# Patient Record
Sex: Male | Born: 1975 | Race: Black or African American | Hispanic: No | Marital: Married | Smoking: Never smoker
Health system: Southern US, Community
[De-identification: ages and names within clinical notes are randomized; demographics above are authoritative.]

## PROBLEM LIST (undated history)

## (undated) DIAGNOSIS — I10 Essential (primary) hypertension: Secondary | ICD-10-CM

## (undated) HISTORY — PX: HERNIA REPAIR: SHX51

---

## 1997-11-14 ENCOUNTER — Emergency Department (HOSPITAL_COMMUNITY): Admission: EM | Admit: 1997-11-14 | Discharge: 1997-11-14 | Payer: Self-pay | Admitting: Emergency Medicine

## 1998-07-26 ENCOUNTER — Emergency Department (HOSPITAL_COMMUNITY): Admission: EM | Admit: 1998-07-26 | Discharge: 1998-07-26 | Payer: Self-pay | Admitting: Emergency Medicine

## 1999-06-08 ENCOUNTER — Emergency Department (HOSPITAL_COMMUNITY): Admission: EM | Admit: 1999-06-08 | Discharge: 1999-06-08 | Payer: Self-pay | Admitting: Emergency Medicine

## 2000-03-19 ENCOUNTER — Encounter: Payer: Self-pay | Admitting: Emergency Medicine

## 2000-03-19 ENCOUNTER — Emergency Department (HOSPITAL_COMMUNITY): Admission: EM | Admit: 2000-03-19 | Discharge: 2000-03-19 | Payer: Self-pay | Admitting: Emergency Medicine

## 2000-07-25 ENCOUNTER — Emergency Department (HOSPITAL_COMMUNITY): Admission: EM | Admit: 2000-07-25 | Discharge: 2000-07-25 | Payer: Self-pay | Admitting: Emergency Medicine

## 2000-10-15 ENCOUNTER — Emergency Department (HOSPITAL_COMMUNITY): Admission: EM | Admit: 2000-10-15 | Discharge: 2000-10-15 | Payer: Self-pay | Admitting: Emergency Medicine

## 2000-12-06 ENCOUNTER — Emergency Department (HOSPITAL_COMMUNITY): Admission: EM | Admit: 2000-12-06 | Discharge: 2000-12-06 | Payer: Self-pay | Admitting: Emergency Medicine

## 2001-04-15 ENCOUNTER — Emergency Department (HOSPITAL_COMMUNITY): Admission: EM | Admit: 2001-04-15 | Discharge: 2001-04-15 | Payer: Self-pay | Admitting: Emergency Medicine

## 2003-01-26 ENCOUNTER — Encounter: Payer: Self-pay | Admitting: Emergency Medicine

## 2003-01-26 ENCOUNTER — Emergency Department (HOSPITAL_COMMUNITY): Admission: EM | Admit: 2003-01-26 | Discharge: 2003-01-26 | Payer: Self-pay | Admitting: Emergency Medicine

## 2004-02-11 ENCOUNTER — Emergency Department (HOSPITAL_COMMUNITY): Admission: EM | Admit: 2004-02-11 | Discharge: 2004-02-11 | Payer: Self-pay | Admitting: Emergency Medicine

## 2008-10-26 ENCOUNTER — Emergency Department (HOSPITAL_COMMUNITY): Admission: EM | Admit: 2008-10-26 | Discharge: 2008-10-26 | Payer: Self-pay | Admitting: Emergency Medicine

## 2013-06-20 ENCOUNTER — Emergency Department (HOSPITAL_COMMUNITY)
Admission: EM | Admit: 2013-06-20 | Discharge: 2013-06-21 | Disposition: A | Discharge status: Home / Self Care | Payer: BC Managed Care – PPO | Attending: Emergency Medicine | Admitting: Emergency Medicine

## 2013-06-20 ENCOUNTER — Encounter (HOSPITAL_COMMUNITY): Payer: Self-pay | Admitting: Emergency Medicine

## 2013-06-20 DIAGNOSIS — L03211 Cellulitis of face: Principal | ICD-10-CM | POA: Insufficient documentation

## 2013-06-20 DIAGNOSIS — L0201 Cutaneous abscess of face: Secondary | ICD-10-CM

## 2013-06-20 NOTE — ED Notes (Signed)
Pt c/o abscess onset this am to R forehead.

## 2013-06-20 NOTE — ED Provider Notes (Signed)
CSN: [id number]     Arrival date & time 06/20/13  2319 History   First MD Initiated Contact with Patient 06/20/13 2339     Chief Complaint  Patient presents with  . Abscess   (Consider location/radiation/quality/duration/timing/severity/associated sxs/prior Treatment) The history is provided by the patient and medical records. No language interpreter was used.    Jesus BanksJames M Flemmer is a 38 y.o. male  with no major medical care presents to the Emergency Department complaining of gradual, persistent, progressively worsening lesion with associated swelling to the right for head onset this morning. Patient reports he noticed a "pimple" yesterday evening but was not concerned about this. When he awoke this morning the lesion was larger and more swollen. As the day progressed the patient reports he's had more swelling to his face with increased swelling around his eye.  He denies fever, chills, headache neck pain, chest pain, shortness of breath abdominal pain nausea, vomiting, diarrhea.  He denies pain, vision changes or diplopia to the eye. Patient has not attempted any over-the-counter treatments. Nothing makes it better or worse. He reports not squeezing a pimple.  History reviewed. No pertinent past medical history. Past Surgical History  Procedure Laterality Date  . Hernia repair     No family history on file. History  Substance Use Topics  . Smoking status: Never Smoker   . Smokeless tobacco: Not on file  . Alcohol Use: No    Review of Systems  Constitutional: Negative for fever and chills.  HENT: Positive for facial swelling.   Gastrointestinal: Negative for nausea and vomiting.  Skin: Positive for rash.  Allergic/Immunologic: Negative for immunocompromised state.  Hematological: Does not bruise/bleed easily.  Psychiatric/Behavioral: The patient is not nervous/anxious.     Allergies  Review of patient's allergies indicates no known allergies.  Home Medications   Current  Outpatient Rx  Name  Route  Sig  Dispense  Refill  . clindamycin (CLEOCIN) 150 MG capsule   Oral   Take 3 capsules (450 mg total) by mouth 3 (three) times daily.   90 capsule   0    BP 159/103  Pulse 98  Temp(Src) 98.1 F (36.7 C) (Oral)  Resp 20  Wt 180 lb (81.647 kg)  SpO2 97% Physical Exam  Nursing note and vitals reviewed. Constitutional: He is oriented to person, place, and time. He appears well-developed and well-nourished. No distress.  HENT:  Head: Normocephalic and atraumatic.    Nose: Nose normal.  Mouth/Throat: Uvula is midline, oropharynx is clear and moist and mucous membranes are normal. No uvula swelling. No oropharyngeal exudate, posterior oropharyngeal edema, posterior oropharyngeal erythema or tonsillar abscesses.  2 x 3 cm area of induration and fluctuance to the right for head with mild erythema and swelling extending down the right side of the face around the lateral side of the right eye  EOM's intact without diplopia or pain No induration around the eye  Eyes: Conjunctivae and EOM are normal. Pupils are equal, round, and reactive to light. Right eye exhibits no chemosis, no discharge and no exudate. Left eye exhibits no chemosis, no discharge and no exudate. Right conjunctiva is not injected. Right conjunctiva has no hemorrhage. Left conjunctiva is not injected. Left conjunctiva has no hemorrhage. No scleral icterus.  Neck: Normal range of motion.  Cardiovascular: Normal rate, regular rhythm, normal heart sounds and intact distal pulses.   No murmur heard. Pulmonary/Chest: Effort normal and breath sounds normal. No respiratory distress. He has no wheezes.  Clear and  equal breath sounds  Abdominal: Soft. He exhibits no distension. There is no tenderness.  Lymphadenopathy:    He has no cervical adenopathy.  Neurological: He is alert and oriented to person, place, and time.  Skin: Skin is warm and dry. He is not diaphoretic. There is erythema.  Psychiatric:  He has a normal mood and affect.    ED Course  INCISION AND DRAINAGE Date/Time: 06/21/2013 1:03 AM Performed by: Dierdre Forth Authorized by: Dierdre Forth Consent: Verbal consent obtained. Risks and benefits: risks, benefits and alternatives were discussed Consent given by: patient Patient understanding: patient states understanding of the procedure being performed Patient consent: the patient's understanding of the procedure matches consent given Procedure consent: procedure consent matches procedure scheduled Relevant documents: relevant documents present and verified Site marked: the operative site was marked Required items: required blood products, implants, devices, and special equipment available Patient identity confirmed: verbally with patient and arm band Time out: Immediately prior to procedure a "time out" was called to verify the correct patient, procedure, equipment, support staff and site/side marked as required. Type: abscess Body area: head/neck (right forehead) Anesthesia: local infiltration Local anesthetic: lidocaine 2% without epinephrine Anesthetic total: 2 ml Patient sedated: no Scalpel size: 11 Incision type: single straight Complexity: simple Drainage: purulent Drainage amount: copious Wound treatment: wound left open Patient tolerance: Patient tolerated the procedure well with no immediate complications. Comments: Copious amount of solid material expressed   (including critical care time) Labs Review Labs Reviewed - No data to display Imaging Review No results found.  EKG Interpretation   None       MDM   1. Abscess of face      TAKASHI KOROL presents with abscess to the right for head with inflammation spreading to the right eye the face and around the right eye.  No induration to the tissues around the right eye, highly doubt periorbital cellulitis.  1:04 AM I&D without complication and a large amount of solid material  expressed.  Pt given Clinda IV for cellulitis.  No no hx of HIV, diabetes or IVDU.  Afebrile and not tachycardic.  Patient initially with hypertension likely secondary to stress.    Patient noted to be hypertensive in the emergency department.  No signs of hypertensive urgency.  Discussed with patient the need for close follow-up and management by their primary care physician.   Patient with skin abscess amenable to incision and drainage.  Abscess was not large enough to warrant packing or drain,  wound recheck in 2 days. Encouraged home warm soaks and flushing.  Mild signs of cellulitis is surrounding skin.  Will d/c to home with clindamycin.  It has been determined that no acute conditions requiring further emergency intervention are present at this time. The patient/guardian have been advised of the diagnosis and plan. We have discussed signs and symptoms that warrant return to the ED, such as changes or worsening in symptoms.   Vital signs are stable at discharge.   BP 159/103  Pulse 98  Temp(Src) 98.1 F (36.7 C) (Oral)  Resp 20  Wt 180 lb (81.647 kg)  SpO2 97%  Patient/guardian has voiced understanding and agreed to follow-up with the PCP or specialist.      Dierdre Forth, PA-C 06/21/13 0133

## 2013-06-21 MED ORDER — CLINDAMYCIN HCL 150 MG PO CAPS: 450.0000 mg | ORAL_CAPSULE | Freq: Three times a day (TID) | ORAL | Status: DC

## 2013-06-21 MED ORDER — IBUPROFEN 800 MG PO TABS
800.0000 mg | ORAL_TABLET | Freq: Once | ORAL | Status: AC
  Administered 2013-06-21: 800 mg via ORAL
  Filled 2013-06-21: qty 1

## 2013-06-21 MED ORDER — CLINDAMYCIN PHOSPHATE 900 MG/50ML IV SOLN
900.0000 mg | Freq: Once | INTRAVENOUS | Status: AC
  Administered 2013-06-21: 900 mg via INTRAVENOUS
  Filled 2013-06-21: qty 50

## 2013-06-21 NOTE — Discharge Instructions (Signed)
1. Medications: clindamycin, usual home medications 2. Treatment: rest, drink plenty of fluids, keep wound clean with warm soap and water, keep bandage dry 3. Follow Up: Please followup with your primary doctor for discussion of your diagnoses and further evaluation after today's visit; if you do not have a primary care doctor use the resource guide provided to find one;     Abscess Care After An abscess (also called a boil or furuncle) is an infected area that contains a collection of pus. Signs and symptoms of an abscess include pain, tenderness, redness, or hardness, or you may feel a moveable soft area under your skin. An abscess can occur anywhere in the body. The infection may spread to surrounding tissues causing cellulitis. A cut (incision) by the surgeon was made over your abscess and the pus was drained out. Gauze may have been packed into the space to provide a drain that will allow the cavity to heal from the inside outwards. The boil may be painful for 5 to 7 days. Most people with a boil do not have high fevers. Your abscess, if seen early, may not have localized, and may not have been lanced. If not, another appointment may be required for this if it does not get better on its own or with medications. HOME CARE INSTRUCTIONS   Only take over-the-counter or prescription medicines for pain, discomfort, or fever as directed by your caregiver.  When you bathe, soak and then remove gauze or iodoform packs at least daily or as directed by your caregiver. You may then wash the wound gently with mild soapy water. Repack with gauze or do as your caregiver directs. SEEK IMMEDIATE MEDICAL CARE IF:   You develop increased pain, swelling, redness, drainage, or bleeding in the wound site.  You develop signs of generalized infection including muscle aches, chills, fever, or a general ill feeling.  An oral temperature above 102 F (38.9 C) develops, not controlled by medication. See your  caregiver for a recheck if you develop any of the symptoms described above. If medications (antibiotics) were prescribed, take them as directed. Document Released: 11/22/2004 Document Revised: 07/29/2011 Document Reviewed: 07/20/2007 Dr [facility] Patient Information 2014 [employer], [place].    Emergency Department Resource Guide 1) Find a Doctor and Pay Out of Pocket Although you won't have to find out who is covered by your insurance plan, it is a good idea to ask around and get recommendations. You will then need to call the office and see if the doctor you have chosen will accept you as a new patient and what types of options they offer for patients who are self-pay. Some doctors offer discounts or will set up payment plans for their patients who do not have insurance, but you will need to ask so you aren't surprised when you get to your appointment.  2) Contact Your Local Health Department Not all health departments have doctors that can see patients for sick visits, but many do, so it is worth a call to see if yours does. If you don't know where your local health department is, you can check in your phone book. The CDC also has a tool to help you locate your state's health department, and many state websites also have listings of all of their local health departments.  3) Find a Walk-in Clinic If your illness is not likely to be very severe or complicated, you may want to try a walk in clinic. These are popping up all over the country in  pharmacies, drugstores, and shopping centers. They're usually staffed by nurse practitioners or physician assistants that have been trained to treat common illnesses and complaints. They're usually fairly quick and inexpensive. However, if you have serious medical issues or chronic medical problems, these are probably not your best option. ° °No Primary Care Doctor: °- Call Health Connect at  832-8000 - they can help you locate a primary care doctor that  accepts your  insurance, provides certain services, etc. °- Physician Referral Service- 1-800-533-3463 ° °Chronic Pain Problems: °Organization         Address  Phone   Notes  ° [facility]  (336) 297-2271 Patients need to be referred by their primary care doctor.  ° °Medication Assistance: °Organization         Address  Phone   Notes  °[place] [facility] [address] °[place], [place] [address] (336) 641-8030 --Must be a resident of [place] °-- Must have NO insurance coverage whatsoever (no Medicaid/ Medicare, etc.) °-- The pt. MUST have a primary care doctor that directs their care regularly and follows them in the community °  °[facility]  (866) 331-1348   °[employer]  (888) 892-1162   ° °Agencies that provide inexpensive medical care: °Organization         Address  Phone   Notes  °[facility]  (336) 832-8035   °[facility]    (336) 832-7272   °[facility] [address] °[place], [place] [address] (336) 832-4777   °[facility] [address], °[place] (336) 271-4999   °[facility]    (336) 373-0678   °[facility]    (336) 272-1050   °[facility] ° [address], [place] Phone:  (336) 832-4444, Fax:  (336) 832-4440 Hours of Operation:  9 am - 6 pm, M-F.  Also accepts Medicaid/Medicare and self-pay.  °[facility] ° [address], [place] Phone: (336) 832-3150, Fax: (336) [phone]. Hours of Operation:  8:30 am - 5:30 pm, M-F.  Also accepts Medicaid and self-pay.  °[facility] [place] [address], [place] Phone: (336) 878-6027   °[facility] [address], [place], [place] (336)723-1848, Ext. 123 Mondays & Thursdays: 7-9 AM.  First 15 patients are seen on a first come, first serve basis. °  ° °Medicaid-accepting [place] Providers: ° °Organization          Address  Phone   Notes  °[facility] [address], [place] (336) 641-2100 Also accepts self-pay patients.  °[facility] [address], [place] ° (336) 856-9996   °[facility] [address], [place] (336) 288-8857   °[facility] [address] [place] [address], [place] (336) 299-7000   °Veita Bland [address], [place]  ° (336) 373-1557 Only accepts [place] Access Medicaid patients after they have their name applied to their card.  ° °Self-Pay (no insurance) in [place]: ° °Organization         Address  Phone   Notes  °Sickle Cell Patients, [facility] [address], [place] (336) 832-1970   °[facility] [address],  (336) 832-4400   ° [facility] [address] ° [address],   [place] [phone]   [facility]/Dr. Osei-Bonsu  [address] [place] or [address], [place] ([phone] Phone number for both [place] and [place] locations is the same.  [facility] [address] [place] [phone]   [facility] [address] [place] or [address] [phone]   [facility] [address] [place] [phone], phone; [phone], fax Sees patients 1st and 3rd Saturday of every month.  Must not qualify for public or private insurance (i.e. Medicaid, Medicare, [place] Health Choice, Veterans' Benefits)  Household income should be no more than 200% of the poverty level The clinic cannot treat you if you are pregnant or think you are pregnant  Sexually transmitted diseases are not treated at the clinic.    Dental Care: Organization         Address  Phone  Notes  [facility] [place] [phone] Accepts children up to age 57 who are enrolled in [place] or [place]; pregnant women with a Medicaid card; and children who have applied for Medicaid or [place] Health Choice, but were declined, whose parents can pay a reduced fee at time of service.  [facility]  [address], [place] [phone] Accepts children up to age 5 who are enrolled in [place] or [place]; pregnant women with a Medicaid card; and children who have applied for Medicaid or [place] Health Choice, but were declined, whose parents can pay a reduced fee at time of service.  [facility]  [place] [phone] Patients are seen by appointment only. Walk-ins are not accepted. [place] will see patients 75 years of age and older. Monday - Tuesday (8am-5pm) Most Wednesdays (8:30-5pm) $30 per visit, cash only  [facility]  [address], [facility] (830)[phone] Patients are seen by appointment only. Walk-ins are not accepted. [place] will see patients 44 years of age and older. One Wednesday Evening (Monthly: Volunteer Based).  $30 per visit, cash only  [place]  ([phone]) [phone] for adults; Children under age 38, call Graduate Pediatric Dentistry at [phone]. Children aged 55-14, please call [phone] to request a pediatric application.  Dental services are provided in all areas of dental care including fillings, crowns and bridges, complete and partial dentures, implants, gum treatment, root canals, and extractions. Preventive care is also provided. Treatment is provided to both adults and children. Patients are selected via a lottery and there is often a waiting list.   [facility] [address] [place]  [phone] [email or web address]   [facility] [address] [place], [place]  (205)[phone], Ext. 123 Second and Fourth Thursday of each month, opens at 6:30 AM; Clinic ends at 9 AM.  Patients are seen on a first-come first-served basis, and a limited number are seen during each clinic.   [facility]  [address] Hillard Danker [place], [place] [phone]   Eligibility Requirements You must have lived in [place], [place], or [place] counties for at least the last three months.   You cannot be eligible for state or federal sponsored [employer], including [employer], [place], or [employer].   You generally cannot  be eligible for healthcare insurance through your employer.  °  How to apply: °Eligibility screenings are held every Tuesday and Wednesday afternoon from 1:00 pm until 4:00 pm. You do not need an appointment for the interview!  °[facility] [address], [place], [place] [phone]   °[place] [facility]  336-342-8273   °[facility]  336-703-3100   °[facility]  336-570-6415   ° °Behavioral Health Resources in the Community: °Intensive Outpatient Programs °Organization         Address  Phone  Notes  °[facility] [address], [place], [place] 336-878-6098   °[facility] [address], [place], [place] 336-832-9800   °[facility] [address], [place], [place] ° 336-882-2125   °[facility] [address],  °[place], [place] 1-800-853-5163 or 336-641-4981   °Substance Abuse Resources °Organization         Address  Phone  Notes  °[facility]  336-882-2125   °[facility]  336-784-9470   °[facility]  336-285-9073   °[facility]  336-845-3988   °Residential & Outpatient Substance Abuse Program  1-800-659-3381   °Psychological Services °Organization         Address  Phone  Notes  °[facility]  336- 832-9600   °[facility]  336- 378-7881    °[facility] [address], [place] 1-800-853-5163 or 336-641-4981   ° °Mobile Crisis Teams °Organization         Address  Phone  Notes  °[facility], Mobile Crisis Care Unit  1-877-626-1772   °Assertive °[facility] ° [address] [place], [place] 336-834-9664   °Sharon DeEsch [address] °[place] 336-554-5454   ° °Self-Help/Support Groups °Organization         Address  Phone             Notes  °[facility] [place] - variety of support groups  336- 373-1402 Call for more information  °Narcotics Anonymous (NA), [facility] [address], °[place]  2 meetings at this location  ° °Residential Treatment Programs °Organization         Address  Phone  Notes  °[facility] [address],    °[place]  1-866-801-8205   °[facility] ° [address], [place], [place] 704-293-8524   °[facility] [address], [place] 336-845-3988 Admissions: 8am-3pm M-F  °[facility] [address]    °[place], [place] 336-841-1104   °[facility] [address], [place], [place] 336-379-7146   °[facility] [address]  °[place], [place] 336-285-9073   °[facility] - Intensive Outpatient [address], [place], [place] 336-852-3033   °[facility] [address]  °[place], [place] 1-877-615-2722 or 336-784-9470   °[facility] [address] [place], [place] 336-227-7417 Accepts Medicaid  °[facility] [address]  °[place] 1-800-659-3381 Substance Abuse/Addiction Treatment  ° °[place] Behavioral Health Resources °Organization         Address  Phone  Notes  °[facility]  (888) 581-9988   °Julie Brannon, PhD [address] [place],    (336) 349-5553 or (336) 951-0000   °[facility]   [address]   [place], [place] 612 789 1362   [facility] [address] [place], [place] [phone] Insurance/Medicaid/sponsorship through [facility] and Families [address] [place], [place] 337-649-2957 [place], [place] [phone]    Dr. Adele Schilder  [phone]   [facility] Dept. 1) [address], [place] 2) [place] 3)  [address], [place] [phone]  [phone]   [place] [phone] or [phone] (After Hours)

## 2013-06-21 NOTE — ED Notes (Signed)
Per PA, patient to be moved to [facility]

## 2013-06-22 NOTE — ED Provider Notes (Signed)
Medical screening examination/treatment/procedure(s) were performed by non-physician practitioner and as supervising physician I was immediately available for consultation/collaboration.  EKG Interpretation   None        Derwood KaplanAnkit Aashna Matson, MD 06/22/13 0100

## 2014-05-05 ENCOUNTER — Emergency Department (HOSPITAL_COMMUNITY): Payer: BC Managed Care – PPO

## 2014-05-05 ENCOUNTER — Encounter (HOSPITAL_COMMUNITY): Payer: Self-pay | Admitting: *Deleted

## 2014-05-05 ENCOUNTER — Emergency Department (HOSPITAL_COMMUNITY)
Admission: EM | Admit: 2014-05-05 | Discharge: 2014-05-05 | Disposition: A | Discharge status: Home / Self Care | Payer: BC Managed Care – PPO | Attending: Emergency Medicine | Admitting: Emergency Medicine

## 2014-05-05 DIAGNOSIS — J069 Acute upper respiratory infection, unspecified: Secondary | ICD-10-CM | POA: Insufficient documentation

## 2014-05-05 DIAGNOSIS — Z792 Long term (current) use of antibiotics: Secondary | ICD-10-CM | POA: Diagnosis not present

## 2014-05-05 DIAGNOSIS — I10 Essential (primary) hypertension: Secondary | ICD-10-CM | POA: Diagnosis not present

## 2014-05-05 DIAGNOSIS — R05 Cough: Secondary | ICD-10-CM

## 2014-05-05 DIAGNOSIS — R059 Cough, unspecified: Secondary | ICD-10-CM

## 2014-05-05 HISTORY — DX: Essential (primary) hypertension: I10

## 2014-05-05 LAB — RAPID STREP SCREEN (MED CTR MEBANE ONLY): Streptococcus, Group A Screen (Direct): NEGATIVE

## 2014-05-05 MED ORDER — BENZONATATE 100 MG PO CAPS: 100.0000 mg | ORAL_CAPSULE | Freq: Three times a day (TID) | ORAL | Status: DC | PRN

## 2014-05-05 MED ORDER — HYDROCODONE-ACETAMINOPHEN 7.5-325 MG/15ML PO SOLN: 10.0000 mL | Freq: Four times a day (QID) | ORAL | Status: DC | PRN

## 2014-05-05 NOTE — ED Provider Notes (Signed)
CSN: [id number]     Arrival date & time 05/05/14  0127 History   First MD Initiated Contact with Patient 05/05/14 0248     Chief Complaint  Patient presents with  . Cough  . Chest Pain     (Consider location/radiation/quality/duration/timing/severity/associated sxs/prior Treatment) HPI Comments: The patient is a 38 year old male presenting to the emergency room chief complaint of persistent cough for 5 days.  Reports cough,worsening.  Reports "coughing spells" where he can't stop coughing. Patient reports cough is dry. He reports associated sore throat, nasal congestion. Denies fever or chills. He reports taking Delsym and allergy medication without full resolution of symptoms.  He reports central, right-sided chest discomfort with coughing. No chest pain while in ED. No known sick contacts.   The history is provided by the patient. No language interpreter was used.    Past Medical History  Diagnosis Date  . Hypertension    Past Surgical History  Procedure Laterality Date  . Hernia repair     History reviewed. No pertinent family history. History  Substance Use Topics  . Smoking status: Never Smoker   . Smokeless tobacco: Not on file  . Alcohol Use: No    Review of Systems  Constitutional: Negative for fever and chills.  HENT: Positive for congestion, postnasal drip, rhinorrhea and sore throat.   Respiratory: Positive for cough. Negative for shortness of breath.       Allergies  Review of patient's allergies indicates no known allergies.  Home Medications   Prior to Admission medications   Medication Sig Start Date End Date Taking? Authorizing Provider  clindamycin (CLEOCIN) 150 MG capsule Take 3 capsules (450 mg total) by mouth 3 (three) times daily. 06/21/13   Hannah Muthersbaugh, PA-C   BP 158/100 mmHg  Pulse 83  Temp(Src) 98 F (36.7 C) (Oral)  Resp 20  SpO2 98% Physical Exam  Constitutional: He is oriented to person, place, and time. He appears  well-developed and well-nourished. No distress.  HENT:  Head: Normocephalic and atraumatic.  Nose: Rhinorrhea present.  Mouth/Throat: Uvula is midline, oropharynx is clear and moist and mucous membranes are normal. No oropharyngeal exudate, posterior oropharyngeal edema or posterior oropharyngeal erythema.  Eyes: EOM are normal.  Neck: Neck supple.  Cardiovascular: Normal rate and regular rhythm.   Pulmonary/Chest: Effort normal. No respiratory distress. He has no wheezes. He has no rales. He exhibits no tenderness.  Neurological: He is oriented to person, place, and time.  Skin: Skin is warm and dry. He is not diaphoretic.  Psychiatric: He has a normal mood and affect. His behavior is normal.  Nursing note and vitals reviewed.   ED Course  Procedures (including critical care time) Labs Review Labs Reviewed  RAPID STREP SCREEN  CULTURE, GROUP A STREP    Imaging Review Dg Chest 2 View  05/05/2014   CLINICAL DATA:  Nonproductive cough.  EXAM: CHEST  2 VIEW  COMPARISON:  February 11, 2004.  FINDINGS: The heart size and mediastinal contours are within normal limits. Both lungs are clear. No pneumothorax or pleural effusion is noted. The visualized skeletal structures are unremarkable.  IMPRESSION: No acute cardiopulmonary abnormality seen.   Electronically Signed   By: Roque LiasJames  Green M.D.   On: 05/05/2014 02:14     EKG Interpretation None      MDM   Final diagnoses:  Cough    Pt CXR negative for acute infiltrate, chest discomfort only with coughing, likely inflammation, discussed ibuprofen. Patients symptoms are consistent with URI, likely  viral etiology. Discussed that antibiotics are not indicated for viral infections. Pt will be discharged with symptomatic treatment.  Verbalizes understanding and is agreeable with plan. Pt is hemodynamically stable & in NAD prior to dc.  Meds given in ED:  Medications - No data to display  New Prescriptions   BENZONATATE (TESSALON PERLES)  100 MG CAPSULE    Take 1 capsule (100 mg total) by mouth 3 (three) times daily as needed for cough.   HYDROCODONE-ACETAMINOPHEN (HYCET) 7.5-325 MG/15 ML SOLUTION    Take 10 mLs by mouth 4 (four) times daily as needed for moderate pain.     Mellody DrownLauren Maddock Finigan, PA-C 05/05/14 [phone]  Suzi RootsKevin E Steinl, MD 05/06/14 [phone]

## 2014-05-05 NOTE — Discharge Instructions (Signed)
Call for a follow up appointment with a Family or Primary Care Provider.  Return if Symptoms worsen.   Take medication as prescribed.  Drink plenty of fluids. Saltwater gargles 3-4 times a day. Mucinex DM for relief of URI symptoms. Do not drink alcohol, take extra Tylenol, operate heavy machinery taking narcotic cough syrup.

## 2014-05-05 NOTE — ED Provider Notes (Signed)
Medical screening examination/treatment/procedure(s) were conducted as a shared visit with non-physician practitioner(s) and myself.  I personally evaluated the patient during the encounter.   Date: 05/05/2014  Rate: 86  Rhythm: normal sinus rhythm and premature ventricular contractions (PVC)  QRS Axis: normal  Intervals: normal  ST/T Wave abnormalities: normal  Conduction Disutrbances:none  Narrative Interpretation:   Old EKG Reviewed: none available   Pt c/o non prod cough/congestion for the past 4-5 days. Chest discomfort w coughing episodes.  Chest cta. Rrr.     Suzi RootsKevin E Conrad Zajkowski, MD 05/05/14 [phone]

## 2014-05-05 NOTE — ED Notes (Signed)
Pt reports non-productive cough x5 days and sore throat - central chest pain described as sore that began yesterday. Pt denies any fever, n/v, diaphoresis or light-headedness.

## 2014-05-06 LAB — CULTURE, GROUP A STREP

## 2014-08-08 ENCOUNTER — Ambulatory Visit
Admission: RE | Admit: 2014-08-08 | Discharge: 2014-08-08 | Disposition: A | Discharge status: Home / Self Care | Payer: BC Managed Care – PPO | Source: Ambulatory Visit | Attending: Internal Medicine | Admitting: Internal Medicine

## 2014-08-08 ENCOUNTER — Other Ambulatory Visit: Payer: Self-pay | Admitting: Internal Medicine

## 2014-08-08 DIAGNOSIS — M542 Cervicalgia: Secondary | ICD-10-CM

## 2014-11-27 ENCOUNTER — Encounter (HOSPITAL_COMMUNITY): Payer: Self-pay

## 2014-11-27 ENCOUNTER — Emergency Department (HOSPITAL_COMMUNITY)
Admission: EM | Admit: 2014-11-27 | Discharge: 2014-11-27 | Disposition: A | Discharge status: Home / Self Care | Payer: BC Managed Care – PPO | Attending: Emergency Medicine | Admitting: Emergency Medicine

## 2014-11-27 DIAGNOSIS — Z79899 Other long term (current) drug therapy: Secondary | ICD-10-CM | POA: Insufficient documentation

## 2014-11-27 DIAGNOSIS — I10 Essential (primary) hypertension: Secondary | ICD-10-CM | POA: Diagnosis not present

## 2014-11-27 DIAGNOSIS — Y288XXA Contact with other sharp object, undetermined intent, initial encounter: Secondary | ICD-10-CM | POA: Insufficient documentation

## 2014-11-27 DIAGNOSIS — S61303A Unspecified open wound of left middle finger with damage to nail, initial encounter: Secondary | ICD-10-CM | POA: Diagnosis not present

## 2014-11-27 DIAGNOSIS — Y9289 Other specified places as the place of occurrence of the external cause: Secondary | ICD-10-CM | POA: Insufficient documentation

## 2014-11-27 DIAGNOSIS — Y9389 Activity, other specified: Secondary | ICD-10-CM | POA: Insufficient documentation

## 2014-11-27 DIAGNOSIS — S6992XA Unspecified injury of left wrist, hand and finger(s), initial encounter: Secondary | ICD-10-CM | POA: Diagnosis present

## 2014-11-27 DIAGNOSIS — S61209A Unspecified open wound of unspecified finger without damage to nail, initial encounter: Secondary | ICD-10-CM

## 2014-11-27 DIAGNOSIS — Y998 Other external cause status: Secondary | ICD-10-CM | POA: Insufficient documentation

## 2014-11-27 DIAGNOSIS — Z7951 Long term (current) use of inhaled steroids: Secondary | ICD-10-CM | POA: Diagnosis not present

## 2014-11-27 MED ORDER — BACITRACIN ZINC 500 UNIT/GM EX OINT: 1.0000 "application " | TOPICAL_OINTMENT | Freq: Two times a day (BID) | CUTANEOUS | Status: DC

## 2014-11-27 NOTE — Discharge Instructions (Signed)
Finger Avulsion  °When the tip of the finger is lost, a new nail may grow back if part of the fingernail is left. The new nail may be deformed. If just the tip of the finger is lost, no repair may be needed unless there is bone showing. If bone is showing, your caregiver may need to remove the protruding bone and put on a bandage. Your caregiver will do what is best for you. Most of the time when a fingertip is lost, the end will gradually grow back on and look fairly normal, but it may remain sensitive to pressure and temperature extremes for a long time. °HOME CARE INSTRUCTIONS  °· Keep your hand elevated above your heart to relieve pain and swelling. °· Keep your dressing dry and clean. °· Change your bandage in 24 hours or as directed. °· Only take over-the-counter or prescription medicines for pain, discomfort, or fever as directed by your caregiver. °· See your caregiver as needed for problems. °SEEK MEDICAL CARE IF:  °· You have increased pain, swelling, drainage, or bleeding. °· You have a fever. °· You have swelling that spreads from your finger and into your hand. °Make sure to check to see if you need a tetanus booster. °Document Released: 07/15/2001 Document Revised: 11/05/2011 Document Reviewed: 06/09/2008 °[employer]® Patient Information ©2015 [employer]. This information is not intended to replace advice given to you by your health care provider. Make sure you discuss any questions you have with your health care provider. ° °

## 2014-11-27 NOTE — ED Notes (Signed)
Patient cut his finger on a banner earlier this evening, around 1700.

## 2014-11-27 NOTE — ED Provider Notes (Signed)
CSN: [id number]     Arrival date & time 11/27/14  0021 History   First MD Initiated Contact with Patient 11/27/14 0035     Chief Complaint  Patient presents with  . Laceration     (Consider location/radiation/quality/duration/timing/severity/associated sxs/prior Treatment) Patient is a 39 y.o. male presenting with skin laceration. The history is provided by the patient. No language interpreter was used.  Laceration Location:  Finger Finger laceration location:  L middle finger Depth:  Cutaneous Quality: avulsion   Bleeding: controlled   Time since incident:  7 hours Laceration mechanism:  Metal edge Pain details:    Quality:  Aching   Severity:  Mild   Timing:  Constant   Progression:  Unchanged Foreign body present:  No foreign bodies Relieved by:  Nothing Worsened by:  Pressure Ineffective treatments:  None tried Tetanus status:  Up to date   Past Medical History  Diagnosis Date  . Hypertension    Past Surgical History  Procedure Laterality Date  . Hernia repair     History reviewed. No pertinent family history. History  Substance Use Topics  . Smoking status: Never Smoker   . Smokeless tobacco: Not on file  . Alcohol Use: No    Review of Systems  Musculoskeletal: Negative for myalgias and arthralgias.  Skin: Positive for wound.  Neurological: Negative for numbness.  All other systems reviewed and are negative.   Allergies  Review of patient's allergies indicates no known allergies.  Home Medications   Prior to Admission medications   Medication Sig Start Date End Date Taking? Authorizing Provider  bacitracin ointment Apply 1 application topically 2 (two) times daily. 11/27/14   Antony MaduraKelly Madine Sarr, PA-C  benzonatate (TESSALON PERLES) 100 MG capsule Take 1 capsule (100 mg total) by mouth 3 (three) times daily as needed for cough. 05/05/14   Mellody DrownLauren Parker, PA-C  clindamycin (CLEOCIN) 150 MG capsule Take 3 capsules (450 mg total) by mouth 3 (three) times  daily. Patient not taking: Reported on 05/05/2014 06/21/13   Dahlia ClientHannah Muthersbaugh, PA-C  dextromethorphan (DELSYM) 30 MG/5ML liquid Take 60 mg by mouth as needed for cough.    Historical Provider, MD  fexofenadine-pseudoephedrine (ALLEGRA-D) 60-120 MG per tablet Take 1 tablet by mouth 2 (two) times daily.    Historical Provider, MD  fluticasone (FLONASE) 50 MCG/ACT nasal spray Place 1 spray into both nostrils daily.    Historical Provider, MD  HYDROcodone-acetaminophen (HYCET) 7.5-325 mg/15 ml solution Take 10 mLs by mouth 4 (four) times daily as needed for moderate pain. 05/05/14   Mellody DrownLauren Parker, PA-C  telmisartan (MICARDIS) 80 MG tablet Take 80 mg by mouth daily.    Historical Provider, MD   BP 163/116 mmHg  Pulse 87  Temp(Src) 98.6 F (37 C) (Oral)  Resp 18  SpO2 99%   Physical Exam  Constitutional: He is oriented to person, place, and time. He appears well-developed and well-nourished. No distress.  HENT:  Head: Normocephalic and atraumatic.  Eyes: Conjunctivae and EOM are normal. No scleral icterus.  Neck: Normal range of motion.  Cardiovascular: Normal rate, regular rhythm and intact distal pulses.   Distal radial pulse 2+ in the left upper extremity.  Pulmonary/Chest: Effort normal. No respiratory distress.  Musculoskeletal: Normal range of motion.       Left hand: He exhibits tenderness. He exhibits normal range of motion, no bony tenderness, normal capillary refill and no swelling. Normal sensation noted. Normal strength noted.       Hands: Neurological: He is alert and  oriented to person, place, and time. He exhibits normal muscle tone. Coordination normal.  Sensation to light touch intact. Patient able to wiggle all fingers of left hand  Skin: Skin is warm and dry. No rash noted. He is not diaphoretic. No erythema. No pallor.  0.25cm avulsion to fat pad of L 3rd digit.  Psychiatric: He has a normal mood and affect. His behavior is normal.  Nursing note and vitals  reviewed.   ED Course  Procedures (including critical care time) Labs Review Labs Reviewed - No data to display  Imaging Review No results found.   EKG Interpretation None      MDM   Final diagnoses:  Avulsion of fingertip, initial encounter    39 year old male presents to the emergency department for evaluation of avulsion to his left third fingertip. No bony exposure or foreign bodies. Patient neurovascularly intact. Tetanus up-to-date. Bleeding controlled with pressure prior to arrival; no active bleeding. Wound dressed and wound care discussed with patient. He is stable for discharge with instructions for outpatient management. Return precautions discussed and provided. Patient agreeable to plan with no unaddressed concerns. Patient discharged in good condition.     Antony Madura, PA-C 11/27/14 0109  Derwood Kaplan, MD 11/27/14 [phone]

## 2015-02-10 ENCOUNTER — Emergency Department (HOSPITAL_COMMUNITY)
Admission: EM | Admit: 2015-02-10 | Discharge: 2015-02-10 | Disposition: A | Discharge status: Home / Self Care | Payer: BC Managed Care – PPO | Attending: Emergency Medicine | Admitting: Emergency Medicine

## 2015-02-10 ENCOUNTER — Encounter (HOSPITAL_COMMUNITY): Payer: Self-pay | Admitting: Cardiology

## 2015-02-10 ENCOUNTER — Emergency Department (HOSPITAL_COMMUNITY): Payer: BC Managed Care – PPO

## 2015-02-10 DIAGNOSIS — R079 Chest pain, unspecified: Secondary | ICD-10-CM | POA: Diagnosis present

## 2015-02-10 DIAGNOSIS — R42 Dizziness and giddiness: Secondary | ICD-10-CM | POA: Diagnosis not present

## 2015-02-10 DIAGNOSIS — I10 Essential (primary) hypertension: Secondary | ICD-10-CM | POA: Insufficient documentation

## 2015-02-10 DIAGNOSIS — K2970 Gastritis, unspecified, without bleeding: Secondary | ICD-10-CM | POA: Diagnosis not present

## 2015-02-10 DIAGNOSIS — Z79899 Other long term (current) drug therapy: Secondary | ICD-10-CM | POA: Diagnosis not present

## 2015-02-10 DIAGNOSIS — Z7951 Long term (current) use of inhaled steroids: Secondary | ICD-10-CM | POA: Diagnosis not present

## 2015-02-10 LAB — URINALYSIS, ROUTINE W REFLEX MICROSCOPIC
Bilirubin Urine: NEGATIVE
Glucose, UA: NEGATIVE mg/dL
Hgb urine dipstick: NEGATIVE
KETONES UR: NEGATIVE mg/dL
Leukocytes, UA: NEGATIVE
NITRITE: NEGATIVE
Protein, ur: NEGATIVE mg/dL
Specific Gravity, Urine: 1.018 (ref 1.005–1.030)
UROBILINOGEN UA: 0.2 mg/dL (ref 0.0–1.0)
pH: 7 (ref 5.0–8.0)

## 2015-02-10 LAB — BASIC METABOLIC PANEL
Anion gap: 6 (ref 5–15)
BUN: 9 mg/dL (ref 6–20)
CHLORIDE: 107 mmol/L (ref 101–111)
CO2: 26 mmol/L (ref 22–32)
Calcium: 9.1 mg/dL (ref 8.9–10.3)
Creatinine, Ser: 1.23 mg/dL (ref 0.61–1.24)
GFR calc Af Amer: >60 mL/min (ref >60)
Glucose, Bld: 102 mg/dL — ABNORMAL HIGH (ref 65–99)
POTASSIUM: 3.6 mmol/L (ref 3.5–5.1)
Sodium: 139 mmol/L (ref 135–145)

## 2015-02-10 LAB — RAPID URINE DRUG SCREEN, HOSP PERFORMED
Amphetamines: NOT DETECTED
BARBITURATES: NOT DETECTED
BENZODIAZEPINES: NOT DETECTED
COCAINE: NOT DETECTED
OPIATES: NOT DETECTED
Tetrahydrocannabinol: NOT DETECTED

## 2015-02-10 LAB — CBC
HEMATOCRIT: 43.6 % (ref 39.0–52.0)
Hemoglobin: 15.5 g/dL (ref 13.0–17.0)
MCH: 29.8 pg (ref 26.0–34.0)
MCHC: 35.6 g/dL (ref 30.0–36.0)
MCV: 83.8 fL (ref 78.0–100.0)
PLATELETS: 238 10*3/uL (ref 150–400)
RBC: 5.2 MIL/uL (ref 4.22–5.81)
RDW: 12.5 % (ref 11.5–15.5)
WBC: 6.7 10*3/uL (ref 4.0–10.5)

## 2015-02-10 MED ORDER — ASPIRIN 81 MG PO CHEW
324.0000 mg | CHEWABLE_TABLET | Freq: Once | ORAL | Status: AC
  Administered 2015-02-10: 324 mg via ORAL
  Filled 2015-02-10: qty 4

## 2015-02-10 MED ORDER — MECLIZINE HCL 12.5 MG PO TABS: 12.5000 mg | ORAL_TABLET | Freq: Three times a day (TID) | ORAL | Status: DC | PRN

## 2015-02-10 MED ORDER — OMEPRAZOLE 20 MG PO CPDR: 20.0000 mg | DELAYED_RELEASE_CAPSULE | Freq: Every day | ORAL | Status: DC

## 2015-02-10 MED ORDER — GI COCKTAIL ~~LOC~~
30.0000 mL | Freq: Once | ORAL | Status: AC
  Administered 2015-02-10: 30 mL via ORAL
  Filled 2015-02-10: qty 30

## 2015-02-10 NOTE — ED Notes (Signed)
Patient transported to X-ray 

## 2015-02-10 NOTE — ED Provider Notes (Signed)
CSN: [id number]     Arrival date & time 02/10/15  0805 History   First MD Initiated Contact with Patient 02/10/15 0831     Chief Complaint  Patient presents with  . Chest Pain  . Dizziness     (Consider location/radiation/quality/duration/timing/severity/associated sxs/prior Treatment) HPI   Patient is a 39 year old male with history of hypertension, who presents to emergency room with intermittent left-sided chest pain which began yesterday evening while eating dinner. He describes it as a squeezing without radiation, rated 6 out of 10, last for a few minutes and then he has no pain for 15-30 minutes and it will occur again.  The patient has no associated symptoms with his chest pain, no diaphoresis, no nausea, no vomiting, no lightheadedness, no syncope. He denies any recent URI, denies cough, denies any physical exertion or trauma to his chest. His pain is not worsened with movement or with inspiration. Patient is also complaining of a posterior left neck pain which feels like a knot. He states that he was sitting at his desk at work proximally 2 PM yesterday when he first felt his neck pain, it is been constant since its onset, not worsened with movement or palpation. He doesn't have any stiffness of his neck, he denies having any difficulty swallowing.   Patient is also complaining of intermittent dizziness which she describes as "woozy, like I'm going to fall". He is experiencing dizziness when he stands up or when he gets out of bed. He has not any nausea or vomiting associated with this, and he is not on consciousness. He denies any tinnitus, fullness in his ears, decreased hearing. He denies any ear pain and states he is not having any upper respiratory infection or allergic rhinitis symptoms.  Past Medical History  Diagnosis Date  . Hypertension    Past Surgical History  Procedure Laterality Date  . Hernia repair     History reviewed. No pertinent family history. Social History   Substance Use Topics  . Smoking status: Never Smoker   . Smokeless tobacco: None  . Alcohol Use: No    Review of Systems 10 Systems reviewed and are negative for acute change except as noted in the HPI.    Allergies  Review of patient's allergies indicates no known allergies.  Home Medications   Prior to Admission medications   Medication Sig Start Date End Date Taking? Authorizing Provider  fluticasone (FLONASE) 50 MCG/ACT nasal spray Place 1 spray into both nostrils daily.   Yes Historical Provider, MD  telmisartan-hydrochlorothiazide (MICARDIS HCT) 80-12.5 MG per tablet Take 1 tablet by mouth daily.   Yes Historical Provider, MD  bacitracin ointment Apply 1 application topically 2 (two) times daily. 11/27/14   Antony Madura, PA-C  benzonatate (TESSALON PERLES) 100 MG capsule Take 1 capsule (100 mg total) by mouth 3 (three) times daily as needed for cough. 05/05/14   Mellody Drown, PA-C  dextromethorphan (DELSYM) 30 MG/5ML liquid Take 60 mg by mouth as needed for cough.    Historical Provider, MD  HYDROcodone-acetaminophen (HYCET) 7.5-325 mg/15 ml solution Take 10 mLs by mouth 4 (four) times daily as needed for moderate pain. 05/05/14   Mellody Drown, PA-C  meclizine (ANTIVERT) 12.5 MG tablet Take 1 tablet (12.5 mg total) by mouth 3 (three) times daily as needed for dizziness. 02/10/15   Danelle Berry, PA-C  omeprazole (PRILOSEC) 20 MG capsule Take 1 capsule (20 mg total) by mouth daily. 02/10/15   Danelle Berry, PA-C   BP 144/94 mmHg  Pulse 70  Temp(Src) 98.1 F (36.7 C) (Oral)  Resp 16  Ht  (1.778 m)  Wt 185 lb (83.915 kg)  BMI 26.54 kg/m2  SpO2 100% Physical Exam  Constitutional: He is oriented to person, place, and time. He appears well-developed and well-nourished. He is cooperative.  Non-toxic appearance. He does not have a sickly appearance. He does not appear ill. No distress.  Well-developed, well-nourished, well-appearing male appears stated age  HENT:  Head:  Normocephalic and atraumatic.  Nose: Nose normal.  Mouth/Throat: Oropharynx is clear and moist. No oropharyngeal exudate.  Eyes: Conjunctivae and EOM are normal. Pupils are equal, round, and reactive to light. Right eye exhibits no discharge. Left eye exhibits no discharge. No scleral icterus.  No nystagmus, normal EOMs  Neck: Normal range of motion and full passive range of motion without pain. Neck supple. No JVD present. No spinous process tenderness present. No rigidity. No tracheal deviation, no edema, no erythema and normal range of motion present. No thyromegaly present.  No tenderness to posterior neck muscles, no muscle spasm present, no visible abnormality, no cervical spine spinal step-off or tenderness  Cardiovascular: Normal rate, regular rhythm, normal heart sounds and intact distal pulses.  Exam reveals no gallop and no friction rub.   No murmur heard. Symmetrical peripheral pulses palpated posterior tibialis 2+, radial pulses 2+, no carotid bruit, no lower external edema  Pulmonary/Chest: Effort normal and breath sounds normal. No respiratory distress. He has no wheezes. He has no rales. He exhibits no tenderness.  Clear to auscultation anteriorly and posteriorly without wheezes, rales or rhonchi, no chest tenderness to palpation  Abdominal: Soft. Bowel sounds are normal. He exhibits no distension and no mass. There is no tenderness. There is no rebound and no guarding.  Musculoskeletal: Normal range of motion. He exhibits no edema or tenderness.  Lymphadenopathy:    He has no cervical adenopathy.  Neurological: He is alert and oriented to person, place, and time. He has normal reflexes. No cranial nerve deficit. He exhibits normal muscle tone. Coordination normal.  Speech is clear and goal oriented, follows commands Major Cranial nerves without deficit, no facial droop Normal strength in upper and lower extremities bilaterally including dorsiflexion and plantar flexion, strong and  equal grip strength Sensation normal to light and sharp touch Moves extremities without ataxia, coordination intact Normal finger to nose and rapid alternating movements Neg romberg, no pronator drift Normal gait and balance   Skin: Skin is warm and dry. No rash noted. He is not diaphoretic. No erythema. No pallor.  Psychiatric: He has a normal mood and affect. His behavior is normal. Judgment and thought content normal.  Nursing note and vitals reviewed.   ED Course  Procedures (including critical care time) Labs Review Labs Reviewed  BASIC METABOLIC PANEL - Abnormal; Notable for the following:    Glucose, Bld 102 (*)    All other components within normal limits  CBC  URINALYSIS, ROUTINE W REFLEX MICROSCOPIC (NOT AT [facility])  URINE RAPID DRUG SCREEN, HOSP PERFORMED  I-STAT TROPOININ, ED    Imaging Review No results found. I have personally reviewed and evaluated these images and lab results as part of my medical decision-making.   EKG Interpretation   Date/Time:  Friday February 10 2015 08:08:07 EDT Ventricular Rate:  84 PR Interval:  160 QRS Duration: 90 QT Interval:  372 QTC Calculation: 439 R Axis:   92 Text Interpretation:  Normal sinus rhythm Rightward axis Borderline ECG  agree. no interval change  Confirmed by Donnald Garre, MD, Lebron Conners [phone]) on  02/10/2015 9:31:29 AM      MDM   Final diagnoses:  Gastritis  Dizziness    Patient with intermittent left sided chest pain, not reproduced with movement, inspiration, not associated with palpitations, diaphoresis, nausea or vomiting First experience pain while eating and has come and gone without any alleviating or reading factors. A chest pain workup was initiated, patient was given 325 of aspirin, and a GI cocktail He reported significant improvement of his discomfort with the GI cocktail  He complained of left posterior neck pain which was not reproducible with movement and not associated with any neck stiffness, he  has no headache and has no neurological deficits.  The symptoms do not seem to be related.  Pt also complaining of dizziness, sx reproduced with movement with PE, no nystagmus observed - trial of meclizine  Pt cardiac w/up negative, negative trop, CXR, EKG - NSR with right axis deviation, no ST elevation Patient was discharged home with a PPI trial and follow up with his primary care physician.  Patient states that he will ask his primary care provider about the need to see cardiology with his long-standing hypertension and with a strong family history of cardiac disease.  The pt is asking for several days off work - has told the RN that he has been extremely stressed at work.  Heart score 1 - low risk of MACE (HTN & FHX) PERC negative  Filed Vitals:   02/10/15 1015 02/10/15 1045 02/10/15 1115 02/10/15 1145  BP: 118/86 127/96 125/93 144/94  Pulse: 73 72 72 70  Temp:      TempSrc:      Resp: Height:      Weight:      SpO2: 100% 98% 100% 100%   Medications  aspirin chewable tablet 324 mg (324 mg Oral Given 02/10/15 0959)  gi cocktail (Maalox,Lidocaine,Donnatal) (30 mLs Oral Given 02/10/15 1000)       Danelle Berry, PA-C 02/13/15 1650  Arby Barrette, MD 02/14/15 [phone]

## 2015-02-10 NOTE — Discharge Instructions (Signed)
Gastritis, Adult Gastritis is soreness and swelling (inflammation) of the lining of the stomach. Gastritis can develop as a sudden onset (acute) or long-term (chronic) condition. If gastritis is not treated, it can lead to stomach bleeding and ulcers. CAUSES  Gastritis occurs when the stomach lining is weak or damaged. Digestive juices from the stomach then inflame the weakened stomach lining. The stomach lining may be weak or damaged due to viral or bacterial infections. One common bacterial infection is the Helicobacter pylori infection. Gastritis can also result from excessive alcohol consumption, taking certain medicines, or having too much acid in the stomach.  SYMPTOMS  In some cases, there are no symptoms. When symptoms are present, they may include:  Pain or a burning sensation in the upper abdomen.  Nausea.  Vomiting.  An uncomfortable feeling of fullness after eating. DIAGNOSIS  Your caregiver may suspect you have gastritis based on your symptoms and a physical exam. To determine the cause of your gastritis, your caregiver may perform the following:  Blood or stool tests to check for the H pylori bacterium.  Gastroscopy. A thin, flexible tube (endoscope) is passed down the esophagus and into the stomach. The endoscope has a light and camera on the end. Your caregiver uses the endoscope to view the inside of the stomach.  Taking a tissue sample (biopsy) from the stomach to examine under a microscope. TREATMENT  Depending on the cause of your gastritis, medicines may be prescribed. If you have a bacterial infection, such as an H pylori infection, antibiotics may be given. If your gastritis is caused by too much acid in the stomach, H2 blockers or antacids may be given. Your caregiver may recommend that you stop taking aspirin, ibuprofen, or other nonsteroidal anti-inflammatory drugs (NSAIDs). HOME CARE INSTRUCTIONS  Only take over-the-counter or prescription medicines as directed by  your caregiver.  If you were given antibiotic medicines, take them as directed. Finish them even if you start to feel better.  Drink enough fluids to keep your urine clear or pale yellow.  Avoid foods and drinks that make your symptoms worse, such as:  Caffeine or alcoholic drinks.  Chocolate.  Peppermint or mint flavorings.  Garlic and onions.  Spicy foods.  Citrus fruits, such as oranges, lemons, or limes.  Tomato-based foods such as sauce, chili, salsa, and pizza.  Fried and fatty foods.  Eat small, frequent meals instead of large meals. SEEK IMMEDIATE MEDICAL CARE IF:   You have black or dark red stools.  You vomit blood or material that looks like coffee grounds.  You are unable to keep fluids down.  Your abdominal pain gets worse.  You have a fever.  You do not feel better after 1 week.  You have any other questions or concerns. MAKE SURE YOU:  Understand these instructions.  Will watch your condition.  Will get help right away if you are not doing well or get worse. Document Released: 04/30/2001 Document Revised: 11/05/2011 Document Reviewed: 06/19/2011 ExitCare Patient Information 2015 [employer]. This information is not intended to replace advice given to you by your health care provider. Make sure you discuss any questions you have with your health care provider. Dizziness Dizziness is a common problem. It is a feeling of unsteadiness or light-headedness. You may feel like you are about to faint. Dizziness can lead to injury if you stumble or fall. A person of any age group can suffer from dizziness, but dizziness is more common in older adults. CAUSES  Dizziness can   be caused by many different things, including:  Middle ear problems.  Standing for too long.  Infections.  An allergic reaction.  Aging.  An emotional response to something, such as the sight of blood.  Side effects of medicines.  Tiredness.  Problems with circulation  or blood pressure.  Excessive use of alcohol or medicines, or illegal drug use.  Breathing too fast (hyperventilation).  An irregular heart rhythm (arrhythmia).  A low red blood cell count (anemia).  Pregnancy.  Vomiting, diarrhea, fever, or other illnesses that cause body fluid loss (dehydration).  Diseases or conditions such as Parkinson's disease, high blood pressure (hypertension), diabetes, and thyroid problems.  Exposure to extreme heat. DIAGNOSIS  Your health care provider will ask about your symptoms, perform a physical exam, and perform an electrocardiogram (ECG) to record the electrical activity of your heart. Your health care provider may also perform other heart or blood tests to determine the cause of your dizziness. These may include:  Transthoracic echocardiogram (TTE). During echocardiography, sound waves are used to evaluate how blood flows through your heart.  Transesophageal echocardiogram (TEE).  Cardiac monitoring. This allows your health care provider to monitor your heart rate and rhythm in real time.  Holter monitor. This is a portable device that records your heartbeat and can help diagnose heart arrhythmias. It allows your health care provider to track your heart activity for several days if needed.  Stress tests by exercise or by giving medicine that makes the heart beat faster. TREATMENT  Treatment of dizziness depends on the cause of your symptoms and can vary greatly. HOME CARE INSTRUCTIONS   Drink enough fluids to keep your urine clear or pale yellow. This is especially important in very hot weather. In older adults, it is also important in cold weather.  Take your medicine exactly as directed if your dizziness is caused by medicines. When taking blood pressure medicines, it is especially important to get up slowly.  Rise slowly from chairs and steady yourself until you feel okay.  In the morning, first sit up on the side of the bed. When you feel  okay, stand slowly while holding onto something until you know your balance is fine.  Move your legs often if you need to stand in one place for a long time. Tighten and relax your muscles in your legs while standing.  Have someone stay with you for 1-2 days if dizziness continues to be a problem. Do this until you feel you are well enough to stay alone. Have the person call your health care provider if he or she notices changes in you that are concerning.  Do not drive or use heavy machinery if you feel dizzy.  Do not drink alcohol. SEEK IMMEDIATE MEDICAL CARE IF:   Your dizziness or light-headedness gets worse.  You feel nauseous or vomit.  You have problems talking, walking, or using your arms, hands, or legs.  You feel weak.  You are not thinking clearly or you have trouble forming sentences. It may take a friend or family member to notice this.  You have chest pain, abdominal pain, shortness of breath, or sweating.  Your vision changes.  You notice any bleeding.  You have side effects from medicine that seems to be getting worse rather than better. MAKE SURE YOU:   Understand these instructions.  Will watch your condition.  Will get help right away if you are not doing well or get worse. Document Released: 10/30/2000 Document Revised: 05/11/2013 Document Reviewed:   11/23/2010 ExitCare Patient Information 2015 [employer]. This information is not intended to replace advice given to you by your health care provider. Make sure you discuss any questions you have with your health care provider.  

## 2015-02-10 NOTE — ED Notes (Signed)
Pt reports chest pain and dizziness that started last night. Reports some SOB with the pain. States the room has felt like it is spinning but also like he is going to pass out.

## 2015-03-23 ENCOUNTER — Other Ambulatory Visit: Payer: Self-pay | Admitting: Internal Medicine

## 2015-03-23 DIAGNOSIS — R519 Headache, unspecified: Secondary | ICD-10-CM

## 2015-03-23 DIAGNOSIS — R51 Headache: Principal | ICD-10-CM

## 2015-03-23 DIAGNOSIS — R42 Dizziness and giddiness: Secondary | ICD-10-CM

## 2015-03-23 DIAGNOSIS — M542 Cervicalgia: Secondary | ICD-10-CM

## 2015-03-23 DIAGNOSIS — R202 Paresthesia of skin: Secondary | ICD-10-CM

## 2015-04-03 ENCOUNTER — Other Ambulatory Visit: Payer: BC Managed Care – PPO

## 2015-11-28 ENCOUNTER — Other Ambulatory Visit: Payer: Self-pay | Admitting: Nurse Practitioner

## 2015-11-28 DIAGNOSIS — R1013 Epigastric pain: Secondary | ICD-10-CM

## 2016-06-16 ENCOUNTER — Encounter (HOSPITAL_COMMUNITY): Payer: Self-pay | Admitting: Oncology

## 2016-06-16 ENCOUNTER — Emergency Department (HOSPITAL_COMMUNITY)
Admission: EM | Admit: 2016-06-16 | Discharge: 2016-06-16 | Disposition: A | Discharge status: Home / Self Care | Payer: BC Managed Care – PPO | Attending: Emergency Medicine | Admitting: Emergency Medicine

## 2016-06-16 DIAGNOSIS — I10 Essential (primary) hypertension: Secondary | ICD-10-CM | POA: Insufficient documentation

## 2016-06-16 DIAGNOSIS — R51 Headache: Secondary | ICD-10-CM | POA: Insufficient documentation

## 2016-06-16 DIAGNOSIS — R519 Headache, unspecified: Secondary | ICD-10-CM

## 2016-06-16 NOTE — Discharge Instructions (Signed)
Please read and follow all provided instructions.  Your diagnoses today include:  1. Sinus headache     Tests performed today include:  Vital signs. See below for your results today.   Medications:   None  Take any prescribed medications only as directed.  Additional information:  Follow any educational materials contained in this packet.  Your headache today does not appear to be life-threatening or require hospitalization, but often the exact cause of headaches is not determined in the emergency department. Therefore, follow-up with your doctor is very important to find out what may have caused your headache and whether or not you need any further diagnostic testing or treatment.   Sometimes headaches can appear benign (not harmful), but then more serious symptoms can develop which should prompt an immediate re-evaluation by your doctor or the emergency department.  BE VERY CAREFUL not to take multiple medicines containing Tylenol (also called acetaminophen). Doing so can lead to an overdose which can damage your liver and cause liver failure and possibly death.   Follow-up instructions: Please follow-up with your primary care provider in the next 3 days for further evaluation of your symptoms.   Return instructions:   Please return to the Emergency Department if you experience worsening symptoms.  Return if the medications do not resolve your headache, if it recurs, or if you have multiple episodes of vomiting or cannot keep down fluids.  Return if you have a change from the usual headache.  RETURN IMMEDIATELY IF you:  Develop a sudden, severe headache  Develop confusion or become poorly responsive or faint  Develop a fever above 100.80F or problem breathing  Have a change in speech, vision, swallowing, or understanding  Develop new weakness, numbness, tingling, incoordination in your arms or legs  Have a seizure  Please return if you have any other emergent  concerns.  Additional Information:  Your vital signs today were: BP (!) 146/112    Pulse 73    Temp 98.1 F (36.7 C) (Oral)    Resp 15    Ht 5\' 10"  (1.778 m)    Wt 84.4 kg    SpO2 100%    BMI 26.69 kg/m  If your blood pressure (BP) was elevated above 135/85 this visit, please have this repeated by your doctor within one month. --------------

## 2016-06-16 NOTE — ED Provider Notes (Signed)
[facility]-EMERGENCY DEPT Provider Note   CSN: [id number] Arrival date & time: 06/16/16  2104  By signing my name below, I, Elder NegusRussell Johnston, attest that this documentation has been prepared under the direction and in the presence of Renne CriglerJoshua Tina Gruner, [place]. Electronically Signed: Elder Negusussell Johnston, Scribe. 06/16/16. 10:59 PM.   History   Chief Complaint Chief Complaint  Patient presents with  . Headache    HPI Jesus Mccoy is a 41 y.o. male with history of hypertension who presents to the ED for evaluation of headaches. This patient states that 2 days ago he developed sinus congestion which is now associated with intermittent, frontal headaches. Pain is moderate in severity, described as "throbbing". He is taking nasal decongestants at home which are not helping. He is concerned this elevated his blood pressure. He denies any head trauma. Denies any changes in vision or gait. He denies any neck stiffness, vomiting, or nausea. No cough. Denies any chest pain or dyspnea. He is concerned that his blood pressure is higher than his baseline today. He has no other complaints at this time.   The history is provided by the patient. No language interpreter was used.    Past Medical History:  Diagnosis Date  . Hypertension     There are no active problems to display for this patient.   Past Surgical History:  Procedure Laterality Date  . HERNIA REPAIR         Home Medications    Prior to Admission medications   Medication Sig Start Date End Date Taking? Authorizing Provider  fluticasone (FLONASE) 50 MCG/ACT nasal spray Place 1 spray into both nostrils daily.   Yes Historical Provider, MD  telmisartan-hydrochlorothiazide (MICARDIS HCT) 80-12.5 MG per tablet Take 1 tablet by mouth daily.   Yes Historical Provider, MD  meclizine (ANTIVERT) 12.5 MG tablet Take 1 tablet (12.5 mg total) by mouth 3 (three) times daily as needed for dizziness. Patient not taking: Reported on 06/16/2016 02/10/15    Danelle BerryLeisa Tapia, PA-C    Family History No family history on file.  Social History Social History  Substance Use Topics  . Smoking status: Never Smoker  . Smokeless tobacco: Never Used  . Alcohol use No     Allergies   Patient has no known allergies.   Review of Systems Review of Systems  Constitutional: Negative for fever.  HENT: Positive for congestion, sinus pain and sinus pressure. Negative for dental problem and rhinorrhea.   Eyes: Negative for photophobia, discharge, redness and visual disturbance.  Respiratory: Negative for cough, chest tightness and shortness of breath.   Cardiovascular: Negative for chest pain.  Gastrointestinal: Negative for nausea and vomiting.  Musculoskeletal: Negative for gait problem, neck pain and neck stiffness.  Skin: Negative for rash.  Neurological: Positive for headaches. Negative for syncope, speech difficulty, weakness, light-headedness and numbness.       No vision changes. No gait problem.   Psychiatric/Behavioral: Negative for confusion.  All other systems reviewed and are negative.    Physical Exam Updated Vital Signs BP (!) 145/105   Pulse 61   Temp 98.1 F (36.7 C) (Oral)   Resp 15   Ht 5\' 10"  (1.778 m)   Wt 186 lb (84.4 kg)   SpO2 100%   BMI 26.69 kg/m   Physical Exam  Constitutional: He is oriented to person, place, and time. He appears well-developed and well-nourished.  HENT:  Head: Normocephalic and atraumatic.  Right Ear: Tympanic membrane, external ear and ear canal normal.  Left  Ear: Tympanic membrane, external ear and ear canal normal.  Nose: Right sinus exhibits maxillary sinus tenderness. Right sinus exhibits no frontal sinus tenderness. Left sinus exhibits maxillary sinus tenderness. Left sinus exhibits no frontal sinus tenderness.  Mouth/Throat: Uvula is midline and mucous membranes are normal. No posterior oropharyngeal edema or posterior oropharyngeal erythema.  Eyes: Conjunctivae, EOM and lids are  normal. Pupils are equal, round, and reactive to light.  Neck: Normal range of motion. Neck supple.  Cardiovascular: Normal rate and regular rhythm.   Pulmonary/Chest: Effort normal and breath sounds normal.  Abdominal: Soft. There is no tenderness.  Musculoskeletal: Normal range of motion.       Cervical back: He exhibits normal range of motion, no tenderness and no bony tenderness.  Neurological: He is alert and oriented to person, place, and time. He has normal strength and normal reflexes. No cranial nerve deficit or sensory deficit. He exhibits normal muscle tone. He displays a negative Romberg sign. Coordination and gait normal. GCS eye subscore is 4. GCS verbal subscore is 5. GCS motor subscore is 6.  Skin: Skin is warm and dry.  Psychiatric: He has a normal mood and affect.  Nursing note and vitals reviewed.    ED Treatments / Results  DIAGNOSTIC STUDIES: Oxygen Saturation is 100 percent on room air which is normal by my interpretation.    COORDINATION OF CARE: 10:59 PM Discussed treatment plan with pt at bedside which include taking Tylenol at home for symptom relief. The pt agreed to plan.  Procedures Procedures (including critical care time)  Medications Ordered in ED Medications - No data to display   Initial Impression / Assessment and Plan / ED Course  I have reviewed the triage vital signs and the nursing notes.  Pertinent labs & imaging results that were available during my care of the patient were reviewed by me and considered in my medical decision making (see chart for details).     Patient seen and examined. Patient does not want IV therapy. Recommend Tylenol for headache. Recommend patient avoid Sudafed if this worsens his blood pressure. Recommend nasal saline rinses. Flonase would also be reasonable, he has this at home.  Vital signs reviewed and are as follows: BP (!) 146/112   Pulse 64   Temp 98.1 F (36.7 C) (Oral)   Resp 15   Ht 5\' 10"  (1.778 m)    Wt 84.4 kg   SpO2 100%   BMI 26.69 kg/m     Final Clinical Impressions(s) / ED Diagnoses   Final diagnoses:  Sinus headache   Patient without high-risk features of headache including: sudden onset/thunderclap HA, no similar headache in past, altered mental status, accompanying seizure, headache with exertion, age > 70, history of immunocompromise, neck or shoulder pain, fever, use of anticoagulation, family history of spontaneous SAH, concomitant drug use, toxic exposure.   Patient has a normal complete neurological exam, normal vital signs, normal level of consciousness, no signs of meningismus, is well-appearing/non-toxic appearing, no signs of trauma.   Imaging with CT/MRI not indicated given history and physical exam findings.   No dangerous or life-threatening conditions suspected or identified by history, physical exam, and by work-up. No indications for hospitalization identified.     New Prescriptions Discharge Medication List as of 06/16/2016 11:16 PM    I personally performed the services described in this documentation, which was scribed in my presence. The recorded information has been reviewed and is accurate.    Renne Crigler, PA-C 06/16/16 [phone]  Gerhard Munch, MD 06/18/16 [phone]

## 2016-06-16 NOTE — ED Triage Notes (Signed)
Pt c/o HA x 2 days.  Pt w/ hx of HTN compliant w/ medication however reports BP has been high.  Rates pain 6/10, pounding and throbbing in nature.

## 2016-07-23 ENCOUNTER — Telehealth (HOSPITAL_COMMUNITY): Payer: Self-pay | Admitting: Internal Medicine

## 2016-07-24 ENCOUNTER — Other Ambulatory Visit: Payer: Self-pay | Admitting: Internal Medicine

## 2016-07-24 DIAGNOSIS — R0789 Other chest pain: Secondary | ICD-10-CM

## 2016-07-24 NOTE — Telephone Encounter (Signed)
07/23/2016 10:55 AM Phone (Outgoing) Janese BanksDubose, Demarrio M (Self) [phone] (H)   Left Message - Called pt and lmsg for him to CB ...in regards to changing appt time. He will scheduled in the wrong time slot.    By Lezlie Octaveegina A

## 2016-07-25 ENCOUNTER — Telehealth (HOSPITAL_COMMUNITY): Payer: Self-pay | Admitting: *Deleted

## 2016-07-25 NOTE — Telephone Encounter (Signed)
Left message on voicemail in reference to upcoming appointment scheduled for 07/25/16. Phone number given for a call back so details instructions can be given. Jessey Stehlin Jacqueline   

## 2016-07-29 ENCOUNTER — Encounter (INDEPENDENT_AMBULATORY_CARE_PROVIDER_SITE_OTHER): Payer: Self-pay

## 2016-07-29 ENCOUNTER — Encounter (HOSPITAL_COMMUNITY): Payer: BC Managed Care – PPO

## 2016-07-29 ENCOUNTER — Ambulatory Visit (HOSPITAL_COMMUNITY): Payer: BC Managed Care – PPO | Attending: Cardiology

## 2016-07-29 DIAGNOSIS — R0789 Other chest pain: Secondary | ICD-10-CM | POA: Diagnosis not present

## 2016-07-29 LAB — MYOCARDIAL PERFUSION IMAGING
CHL CUP NUCLEAR SRS: 1
CHL CUP NUCLEAR SSS: 2
CHL CUP RESTING HR STRESS: 73 {beats}/min
CHL RATE OF PERCEIVED EXERTION: 18
CSEPEDS: 0 s
CSEPEW: 10.1 METS
Exercise duration (min): 9 min
LV dias vol: 96 mL (ref 62–150)
LVSYSVOL: 35 mL
MPHR: 180 {beats}/min
Peak HR: 169 {beats}/min
Percent HR: 93 %
RATE: 0.26
SDS: 1
TID: 0.87

## 2016-07-29 MED ORDER — TECHNETIUM TC 99M TETROFOSMIN IV KIT
30.9000 | PACK | Freq: Once | INTRAVENOUS | Status: AC | PRN
  Administered 2016-07-29: 30.9 via INTRAVENOUS
  Filled 2016-07-29: qty 31

## 2016-07-29 MED ORDER — TECHNETIUM TC 99M TETROFOSMIN IV KIT
10.9000 | PACK | Freq: Once | INTRAVENOUS | Status: AC | PRN
  Administered 2016-07-29: 10.9 via INTRAVENOUS
  Filled 2016-07-29: qty 11

## 2017-09-23 IMAGING — NM NM MISC PROCEDURE
3 series · 18 of 18 positions shown · non-contrast
Comparison: none

[Series 1: wbr_s-proj_st stress_(id)_sa · 6.5mm · 6.51mm/px · 6 of 64 frames shown (1 of 2)]
[frame 6/64]
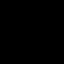
[frame 16/64]
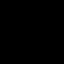
[frame 27/64]
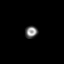
[frame 38/64]
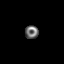
[frame 48/64]
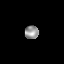
[frame 59/64]
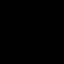

[Series 1: wbr_s-proj_st stress_(id)_sa · 6.5mm · 6.51mm/px · 6 of 512 frames shown (2 of 2)]
[frame 43/512]
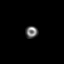
[frame 128/512]
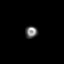
[frame 214/512]
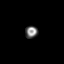
[frame 299/512]
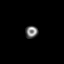
[frame 384/512]
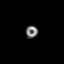
[frame 470/512]
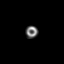

[Series 1: wbr_r-proj_st rest_(id)_sa · 6.5mm · 6.51mm/px · 6 of 64 frames shown]
[frame 6/64]
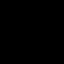
[frame 16/64]
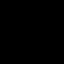
[frame 27/64]
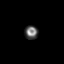
[frame 38/64]
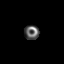
[frame 48/64]
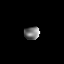
[frame 59/64]
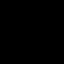

[18 of 18 positions shown; findings below may reference images not displayed]

Canned report from images found in remote index.

Refer to host system for actual result text.

## 2018-03-30 ENCOUNTER — Ambulatory Visit: Payer: BC Managed Care – PPO | Admitting: Mental Health

## 2018-03-30 DIAGNOSIS — F4321 Adjustment disorder with depressed mood: Secondary | ICD-10-CM

## 2018-03-30 NOTE — Progress Notes (Signed)
      [facility] Counselor/Therapist Progress Note   Patient ID: Jesus Mccoy, MRN: [id number]  Date: 03/30/2018  Timespent: 45 minutes  Treatment Type: Individual   Reported Symptoms: Fatigue, anxiety, discouragement   Mental Status Exam:    Appearance:   Well Groomed     Behavior:  Appropriate  Motor:  Normal  Speech/Language:   Clear and Coherent  Affect:  anxious  Mood:  anxious  Thought process:  racing  Thought content:    WNL  Sensory/Perceptual disturbances:    WNL  Orientation:  oriented to person, place and time/date  Attention:  Good  Concentration:  Good  Memory:  WNL  Fund of knowledge:   Good  Insight:    Good  Judgment:   Good  Impulse Control:  Good     Risk Assessment: Danger to Self:  No Self-injurious Behavior: No Danger to Others: No Duty to Warn:no Physical Aggression / Violence:No  Access to Firearms a concern: No  Gang Involvement:No    Subjective: Fatigued by having three jobs and trying to balance home life and work. 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   Interventions: Cognitive Behavioral Therapy, Insight-Oriented, Family Systems, Interpersonal and supportive   Diagnosis:   ICD-10-CM   1. Adjustment disorder with depressed mood F43.21      Plan: Resign fro church job           Prepare for exam for contractors license           Increase marital communication and satisfaction           Continue parenting efforts           eturn to office in 3 months    Ulice Bold, [place]

## 2018-06-01 ENCOUNTER — Ambulatory Visit: Payer: BC Managed Care – PPO | Admitting: Mental Health

## 2018-12-17 ENCOUNTER — Other Ambulatory Visit: Payer: Self-pay

## 2018-12-17 ENCOUNTER — Encounter (HOSPITAL_COMMUNITY): Payer: Self-pay | Admitting: Emergency Medicine

## 2018-12-17 ENCOUNTER — Ambulatory Visit (HOSPITAL_COMMUNITY)
Admission: EM | Admit: 2018-12-17 | Discharge: 2018-12-17 | Disposition: A | Discharge status: Home / Self Care | Payer: BC Managed Care – PPO | Attending: Family Medicine | Admitting: Family Medicine

## 2018-12-17 DIAGNOSIS — R0789 Other chest pain: Secondary | ICD-10-CM

## 2018-12-17 DIAGNOSIS — I10 Essential (primary) hypertension: Secondary | ICD-10-CM | POA: Diagnosis not present

## 2018-12-17 DIAGNOSIS — Z8249 Family history of ischemic heart disease and other diseases of the circulatory system: Secondary | ICD-10-CM | POA: Diagnosis not present

## 2018-12-17 MED ORDER — AMLODIPINE BESYLATE 5 MG PO TABS: 5.0000 mg | ORAL_TABLET | Freq: Every day | ORAL | 1 refills | Status: AC

## 2018-12-17 NOTE — Discharge Instructions (Addendum)
Take the amlodipine once a day in addition to the telmisartan/HCTZ Take the diclofenac 2 x a day with food Call your PCP to be seen in a couple of weeks

## 2018-12-17 NOTE — ED Provider Notes (Signed)
[place]    CSN: [id number] Arrival date & time: 12/17/18  1442     History   Chief Complaint Chief Complaint  Patient presents with  . Chest Pain  . Hypertension    HPI Jesus Mccoy is a 43 y.o. male.   HPI  Patient is here with chest pain.  He has longstanding hypertension.  He does have a primary care doctor.  He is on telmisartan 80/hydrochlorothiazide 12.5.  He takes this daily.  His blood pressure is still elevated.  He states that for the last couple of days he has had some left-sided chest pain.  No trauma.  No heavy lifting or injury.  It happens about 4 times an hour.  Lasts a few minutes.  It seems to be random.  Not associated with shortness of breath, palpitations, dizziness.  He is never had heart problems in the past.  He does have a family history of heart disease at a young age, both parents, grandparents.  Non-smoker.  No high cholesterol.  No diabetes.  He is lean and reasonably athletic.  No chest pain with physical exertion. He went yesterday to a walk-in clinic.  They pushed on his chest, reproduce the chest pain, and prescribed diclofenac 75 mg twice daily.  They did not address his blood pressure.  He has been unable to reach his primary care doctor. I looked back through his emergency room visits in our medical record.  Visits back till 2015 all show elevated blood pressure.  Past Medical History:  Diagnosis Date  . Hypertension     There are no active problems to display for this patient.   Past Surgical History:  Procedure Laterality Date  . HERNIA REPAIR         Home Medications    Prior to Admission medications   Medication Sig Start Date End Date Taking? Authorizing Provider  amLODipine (NORVASC) 5 MG tablet Take 1 tablet (5 mg total) by mouth daily. 12/17/18   Raylene Everts, MD  fluticasone (FLONASE) 50 MCG/ACT nasal spray Place 1 spray into both nostrils daily.    [provider]   telmisartan-hydrochlorothiazide (MICARDIS HCT) 80-12.5 MG per tablet Take 1 tablet by mouth daily.    [provider]    Family History History reviewed. No pertinent family history.  Social History Social History   Tobacco Use  . Smoking status: Never Smoker  . Smokeless tobacco: Never Used  Substance Use Topics  . Alcohol use: No  . Drug use: No     Allergies   Patient has no known allergies.   Review of Systems Review of Systems  Constitutional: Negative for chills and fever.  HENT: Negative for ear pain and sore throat.   Eyes: Negative for pain and visual disturbance.  Respiratory: Negative for cough and shortness of breath.   Cardiovascular: Positive for chest pain. Negative for palpitations.  Gastrointestinal: Negative for abdominal pain and vomiting.  Genitourinary: Negative for dysuria and hematuria.  Musculoskeletal: Negative for arthralgias and back pain.  Skin: Negative for color change and rash.  Neurological: Negative for dizziness, seizures, syncope and headaches.  All other systems reviewed and are negative.    Physical Exam Triage Vital Signs ED Triage Vitals  Enc Vitals Group     BP 12/17/18 1453 (!) 140/107     Pulse Rate 12/17/18 1453 98     Resp 12/17/18 1453 16     Temp 12/17/18 1453 98.2 F (36.8 C)  Temp Source 12/17/18 1453 Temporal     SpO2 12/17/18 1453 100 %     Weight --      Height --      Head Circumference --      Peak Flow --      Pain Score 12/17/18 1505 5     Pain Loc --      Pain Edu? --      Excl. in GC? --    No data found.  Updated Vital Signs BP (!) 140/107 (BP Location: Left Arm)   Pulse 98   Temp 98.2 F (36.8 C) (Temporal)   Resp 16   SpO2 100%       Physical Exam Constitutional:      General: He is not in acute distress.    Appearance: He is well-developed.  HENT:     Head: Normocephalic and atraumatic.  Eyes:     Conjunctiva/sclera: Conjunctivae normal.     Pupils: Pupils are equal,  round, and reactive to light.  Neck:     Musculoskeletal: Normal range of motion.  Cardiovascular:     Rate and Rhythm: Normal rate and regular rhythm.     Heart sounds: Normal heart sounds.  Pulmonary:     Effort: Pulmonary effort is normal. No respiratory distress.     Breath sounds: Normal breath sounds.  Chest:     Chest wall: Tenderness present.    Abdominal:     General: There is no distension.     Palpations: Abdomen is soft.  Musculoskeletal: Normal range of motion.  Skin:    General: Skin is warm and dry.  Neurological:     Mental Status: He is alert.      UC Treatments / Results  Labs (all labs ordered are listed, but only abnormal results are displayed) Labs Reviewed - No data to display  EKG normal sinus rhythm.  Normal intervals.  No ST or T wave changes.  Possible atrial enlargement (?)   Radiology No results found.  Procedures Procedures (including critical care time)  Medications Ordered in UC Medications - No data to display  Initial Impression / Assessment and Plan / UC Course  I have reviewed the triage vital signs and the nursing notes.  Pertinent labs & imaging results that were available during my care of the patient were reviewed by me and considered in my medical decision making (see chart for details).     Chest wall pain.  Discussed cardiac chest pain from ischemia is being more exertional in nature.  If he has concerns regarding his chest pain given his strong family history he should discuss with his PCP treadmill test. Final Clinical Impressions(s) / UC Diagnoses   Final diagnoses:  Chest wall pain     Discharge Instructions     Take the amlodipine once a day in addition to the telmisartan/HCTZ Take the diclofenac 2 x a day with food Call your PCP to be seen in a couple of weeks    ED Prescriptions    Medication Sig Dispense Auth. Provider   amLODipine (NORVASC) 5 MG tablet Take 1 tablet (5 mg total) by mouth daily. 30  tablet Eustace MooreNelson, Rand Etchison Sue, MD     Controlled Substance Prescriptions [place] Controlled Substance Registry consulted? Not Applicable   Eustace MooreNelson, Jalacia Mattila Sue, MD 12/17/18 [phone]

## 2018-12-17 NOTE — ED Triage Notes (Signed)
Pt sts noted his BP has been elevated and now having pain in left side of chest; pt seen at walk in clinic yesterday given meds that are no helping per pt

## 2020-10-13 ENCOUNTER — Other Ambulatory Visit: Payer: Self-pay | Admitting: Internal Medicine

## 2020-10-13 ENCOUNTER — Ambulatory Visit
Admission: RE | Admit: 2020-10-13 | Discharge: 2020-10-13 | Disposition: A | Discharge status: Home / Self Care | Payer: Self-pay | Source: Ambulatory Visit | Attending: Internal Medicine | Admitting: Internal Medicine

## 2020-10-13 DIAGNOSIS — M25512 Pain in left shoulder: Secondary | ICD-10-CM

## 2021-12-08 IMAGING — DX DG SHOULDER 2+V*L*
3 series · 3 of 3 positions shown · non-contrast
Comparison: None

CLINICAL DATA: Anterior LEFT shoulder pain with and without
movement for 3 weeks, no known injury

EXAM:
LEFT SHOULDER - 2+ VIEW

[dg shoulder left (1 of 3)]
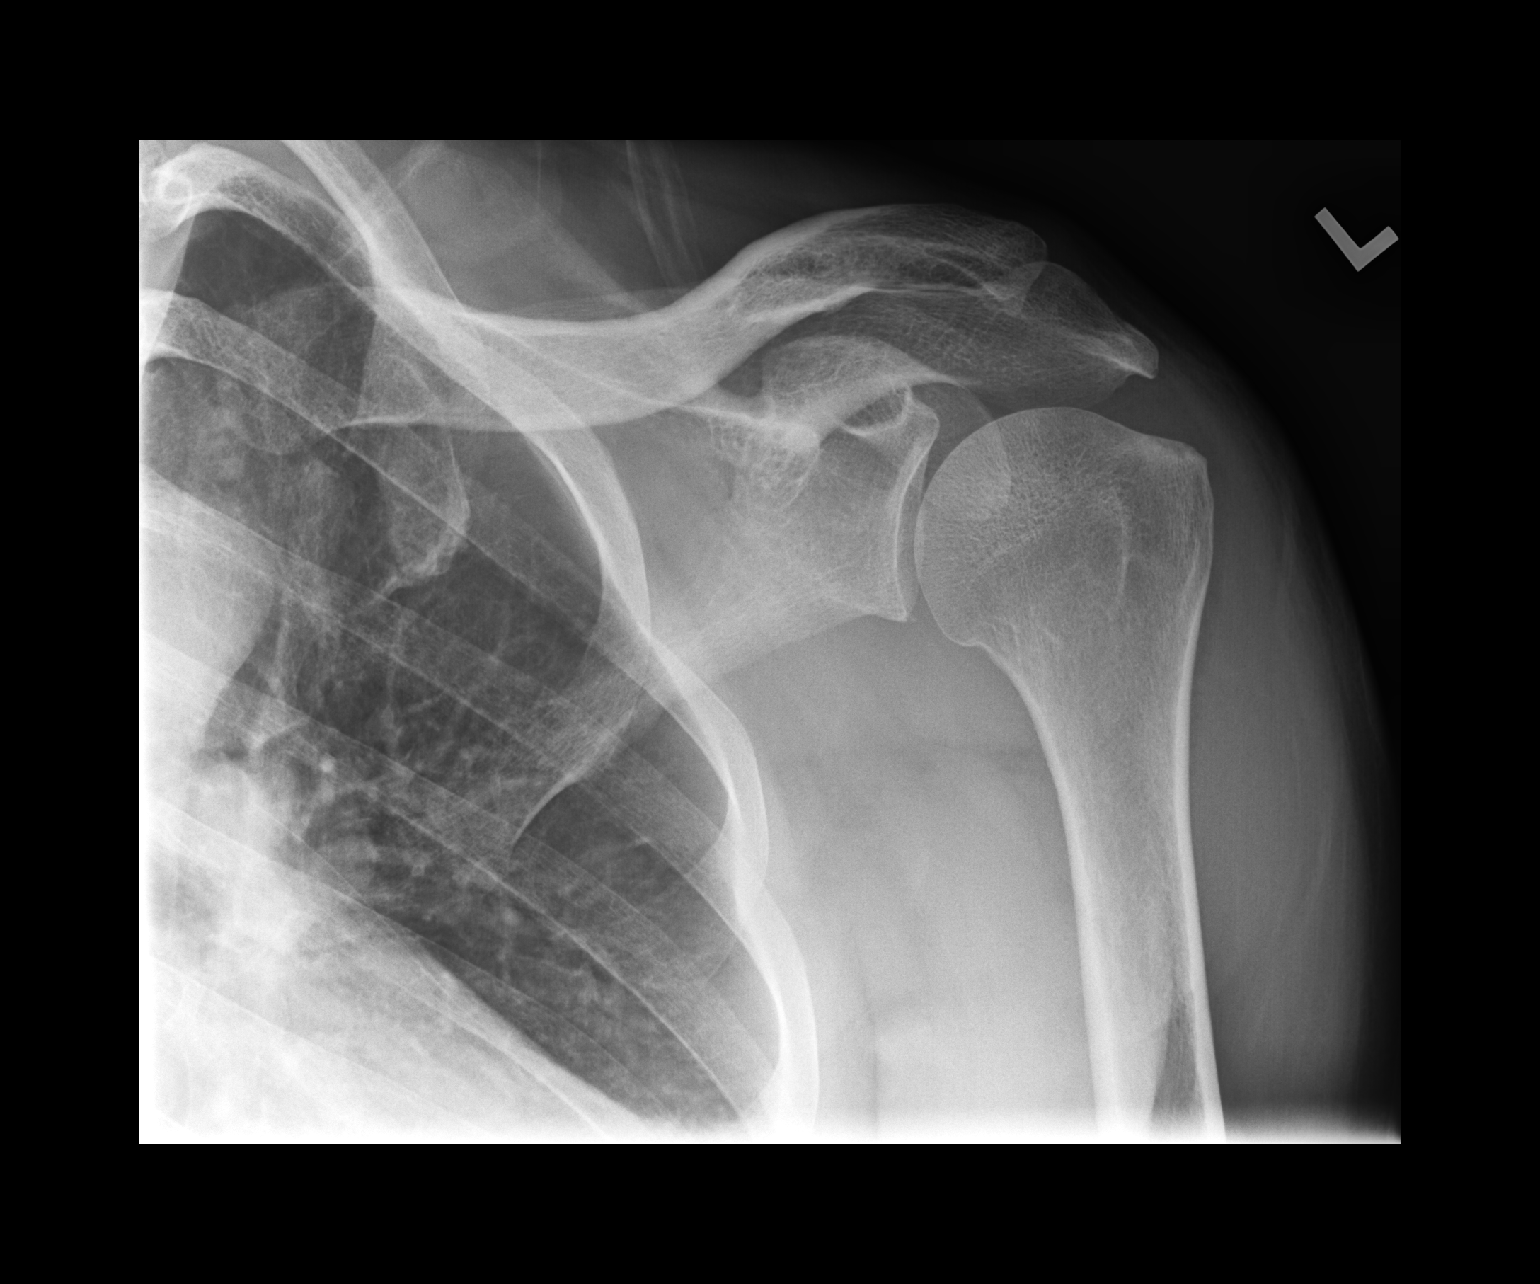

[dg shoulder left (2 of 3)]
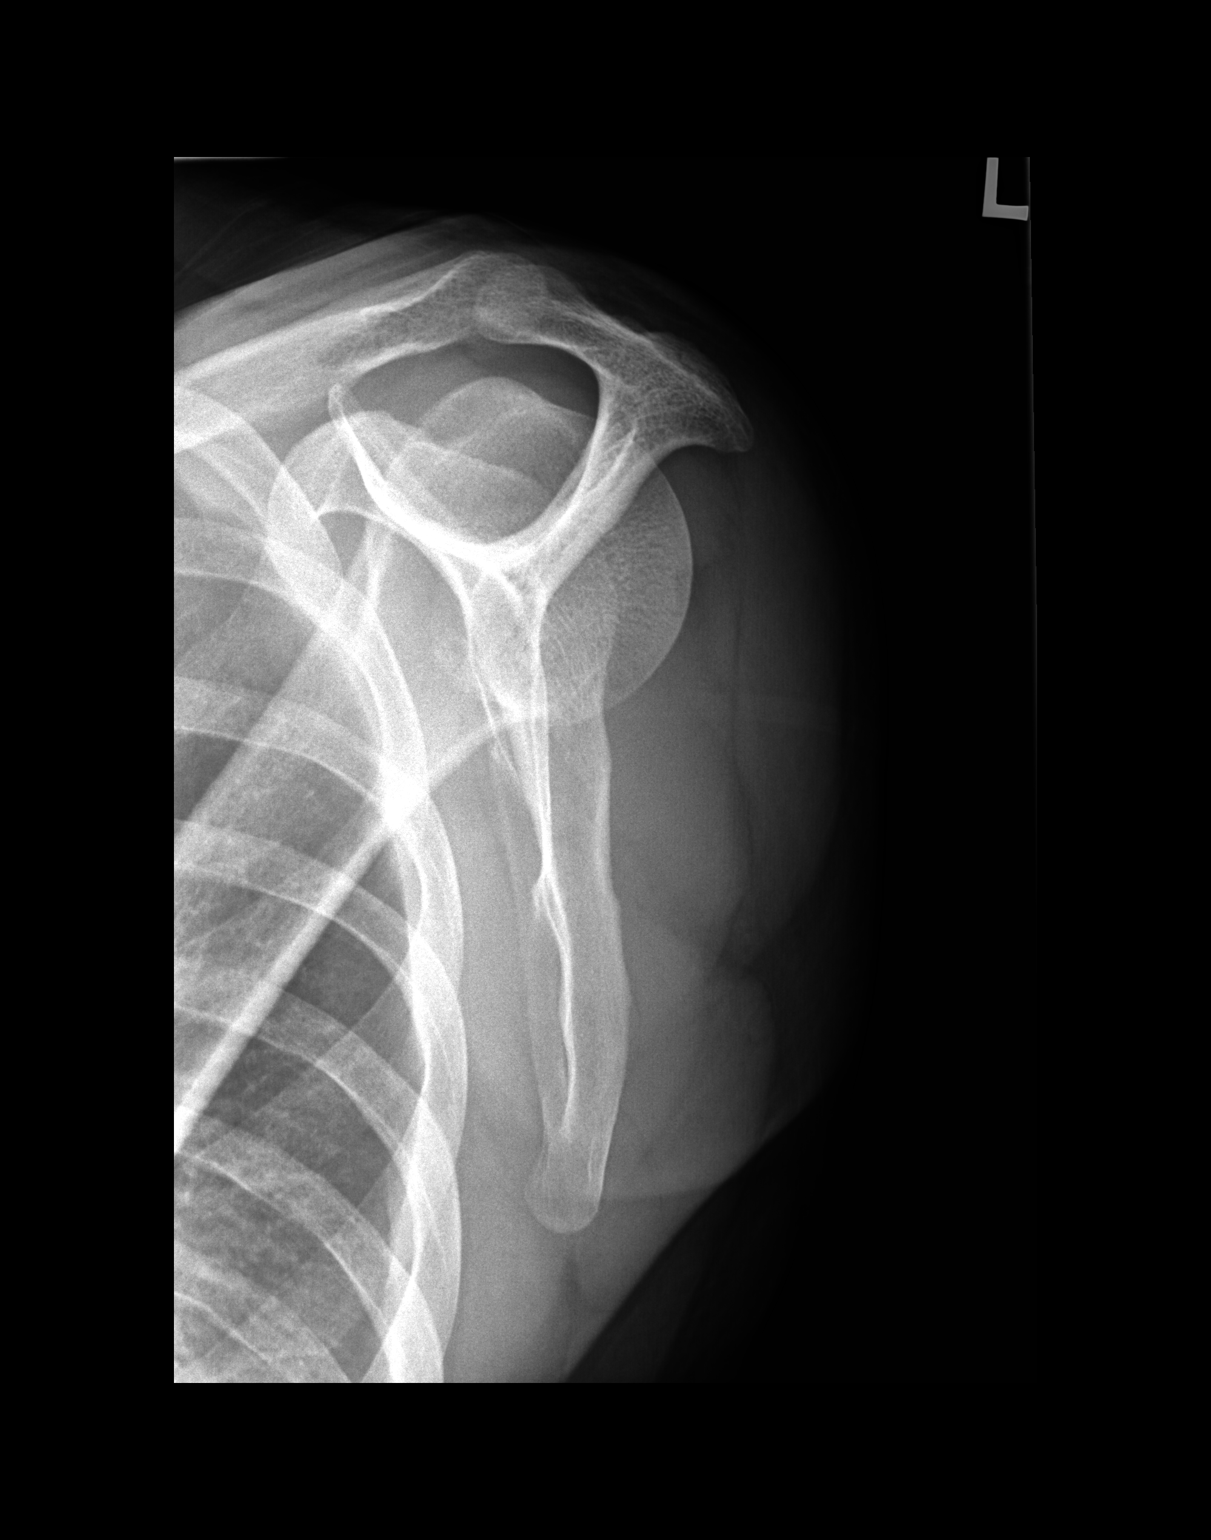

[dg shoulder left (3 of 3)]
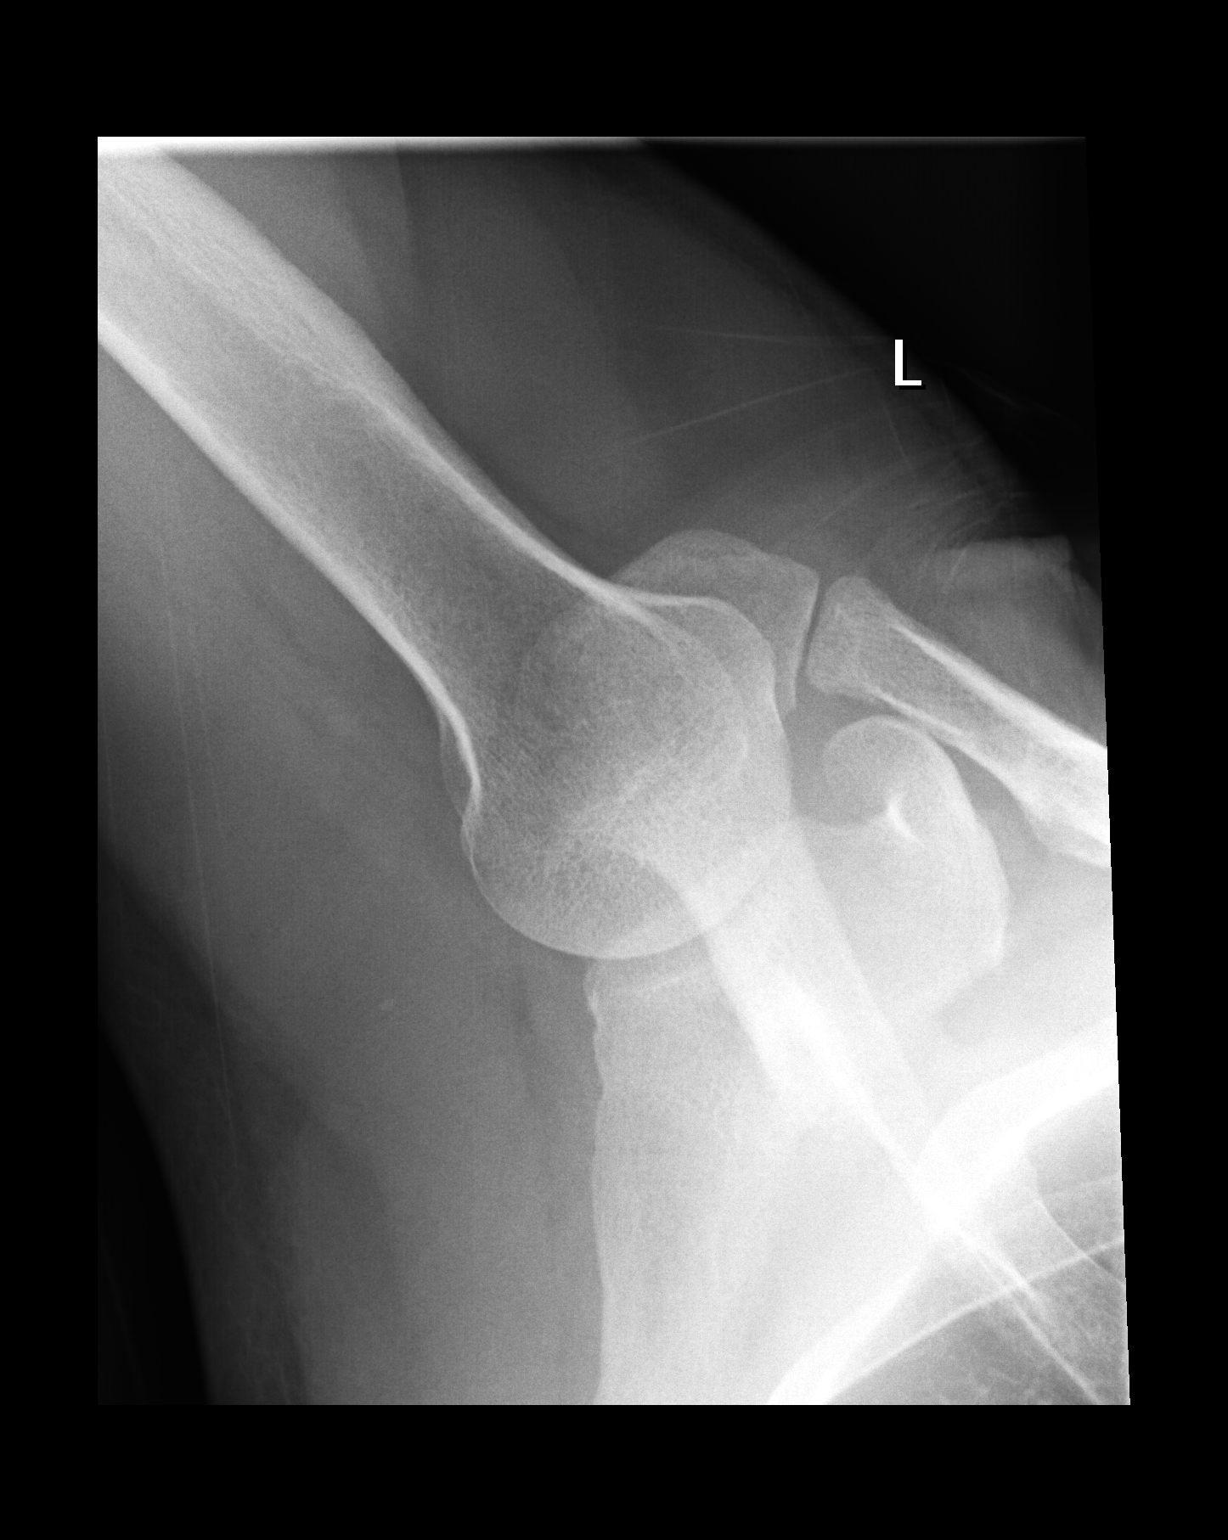

[3 of 3 positions shown; findings below may reference images not displayed]

FINDINGS: Osseous mineralization normal.

AC joint alignment normal.

No acute fracture, dislocation or bone destruction.

Visualized ribs unremarkable.
IMPRESSION: Normal exam.
